# Patient Record
Sex: Male | Born: 1980 | Race: White | Hispanic: No | Marital: Single | Smoking: Current every day smoker
Health system: Southern US, Community
[De-identification: ages and names within clinical notes are randomized; demographics above are authoritative.]

## PROBLEM LIST (undated history)

## (undated) DIAGNOSIS — M1990 Unspecified osteoarthritis, unspecified site: Secondary | ICD-10-CM

---

## 2015-02-25 ENCOUNTER — Encounter: Payer: Self-pay | Admitting: Emergency Medicine

## 2015-02-25 ENCOUNTER — Emergency Department
Admission: EM | Admit: 2015-02-25 | Discharge: 2015-02-25 | Disposition: A | Discharge status: Home / Self Care | Payer: Managed Care, Other (non HMO) | Attending: Emergency Medicine | Admitting: Emergency Medicine

## 2015-02-25 DIAGNOSIS — Z720 Tobacco use: Secondary | ICD-10-CM | POA: Diagnosis not present

## 2015-02-25 DIAGNOSIS — K088 Other specified disorders of teeth and supporting structures: Secondary | ICD-10-CM | POA: Diagnosis present

## 2015-02-25 DIAGNOSIS — K0889 Other specified disorders of teeth and supporting structures: Secondary | ICD-10-CM

## 2015-02-25 HISTORY — DX: Unspecified osteoarthritis, unspecified site: M19.90

## 2015-02-25 MED ORDER — AMOXICILLIN 500 MG PO TABS: 500.0000 mg | ORAL_TABLET | Freq: Three times a day (TID) | ORAL | Status: DC

## 2015-02-25 MED ORDER — HYDROCODONE-ACETAMINOPHEN 5-325 MG PO TABS: 1.0000 | ORAL_TABLET | ORAL | Status: DC | PRN

## 2015-02-25 NOTE — ED Notes (Signed)
toothache

## 2015-02-25 NOTE — Discharge Instructions (Signed)
Dental Pain °A tooth ache may be caused by cavities (tooth decay). Cavities expose the nerve of the tooth to air and hot or cold temperatures. It may come from an infection or abscess (also called a boil or furuncle) around your tooth. It is also often caused by dental caries (tooth decay). This causes the pain you are having. °DIAGNOSIS  °Your caregiver can diagnose this problem by exam. °TREATMENT  °· If caused by an infection, it may be treated with medications which kill germs (antibiotics) and pain medications as prescribed by your caregiver. Take medications as directed. °· Only take over-the-counter or prescription medicines for pain, discomfort, or fever as directed by your caregiver. °· Whether the tooth ache today is caused by infection or dental disease, you should see your dentist as soon as possible for further care. °SEEK MEDICAL CARE IF: °The exam and treatment you received today has been provided on an emergency basis only. This is not a substitute for complete medical or dental care. If your problem worsens or new problems (symptoms) appear, and you are unable to meet with your dentist, call or return to this location. °SEEK IMMEDIATE MEDICAL CARE IF:  °· You have a fever. °· You develop redness and swelling of your face, jaw, or neck. °· You are unable to open your mouth. °· You have severe pain uncontrolled by pain medicine. °MAKE SURE YOU:  °· Understand these instructions. °· Will watch your condition. °· Will get help right away if you are not doing well or get worse. °Document Released: 07/02/2005 Document Revised: 09/24/2011 Document Reviewed: 02/18/2008 °ExitCare® Patient Information ©2015 [employer]. This information is not intended to replace advice given to you by your health care provider. Make sure you discuss any questions you have with your health care provider. °OPTIONS FOR DENTAL FOLLOW UP CARE ° °[place] [facility] Health and Human Services - Local Safety Net Dental  Clinics °http://www.ncdhhs.gov/dph/oralhealth/services/safetynetclinics.htm °  °[facility] (336-562-3123) ° °[facility] (919-933-9087) ° °[facility] (919-663-1744 ext 237) ° °[facility] (336-570-6415) ° °[facility] (919-968-2025) °This clinic caters to the indigent population and is on a lottery system. °Location: °[facility], [address], [place] °Clinic Hours: °Wednesdays from 6pm - 9pm, patients seen by a lottery system. °For dates, call or go to www.med.[facility].edu/[facility]/patients/Dental-[facility] °Services: °Cleanings, fillings and simple extractions. °Payment Options: °DENTAL WORK IS FREE OF CHARGE. Bring proof of income or support. °Best way to get seen: °Arrive at 5:15 pm - this is a lottery, NOT first come/first serve, so arriving earlier will not increase your chances of being seen. °  °  °[facility] °919-537-3737 °Select option 1 for emergencies °  °Location: °[facility], [address], [place] °Clinic Hours: °No walk-ins accepted - call the day before to schedule an appointment. °Check in times are 9:30 am and 1:30 pm. °Services: °Simple extractions, temporary fillings, pulpectomy/pulp debridement, uncomplicated abscess drainage. °Payment Options: °PAYMENT IS DUE AT THE TIME OF SERVICE.  Fee is usually $100-200, additional surgical procedures (e.g. abscess drainage) may be extra. °Cash, checks, Visa/MasterCard accepted.  Can file Medicaid if patient is covered for dental - patient should call case worker to check. °No discount for [facility] patients. °Best way to get seen: °MUST call the day before and get onto the schedule. Can usually be seen the next 1-2 days. No walk-ins accepted. °  °  °[facility] °919-933-9087 °  °Location: °[facility], [address], [place] °Clinic Hours: °M, W, Th, F 8am or 1:30pm, Tues  9a or 1:30 - first come/first served. °Services: °Simple extractions, temporary fillings, uncomplicated abscess drainage.  You do not need to be an [place] resident. °Payment Options: °PAYMENT IS DUE AT THE TIME OF SERVICE. °Dental insurance, otherwise sliding scale - bring proof of income or support. °Depending on income and treatment needed, cost is usually $50-200. °Best way to get seen: °Arrive early as it is first come/first served. °  °  °[facility] °919-542-1641 °  °Location: °[address] °Clinic Hours: °Mon-Thu 8a-5p °Services: °Most basic dental services including extractions and fillings. °Payment Options: °PAYMENT IS DUE AT THE TIME OF SERVICE. °Sliding scale, up to 50% off - bring proof if income or support. °Medicaid with dental option accepted. °Best way to get seen: °Call to schedule an appointment, can usually be seen within 2 weeks OR they will try to see walk-ins - show up at 8a or 2p (you may have to wait). °  °  °[facility] °919-245-2435 °[place] RESIDENTS ONLY °  °Location: °[facility], [address], [place], [place] [address] °Clinic Hours: By appointment only. °Monday - Thursday 8am-5pm, Friday 8am-12pm °Services: Cleanings, fillings, extractions. °Payment Options: °PAYMENT IS DUE AT THE TIME OF SERVICE. °Cash, Visa or MasterCard. Sliding scale - $30 minimum per service. °Best way to get seen: °Come in to office, complete packet and make an appointment - need proof of income °or support monies for each household member and proof of [place] residence. °Usually takes about a month to get in. °  °  °[facility] °919-956-4038 °  °Location: °[address] [place] °Clinic Hours: Walk-in Urgent Care Dental Services are offered Monday-Friday mornings only. °The numbers of emergencies accepted daily is limited to the number of °providers available. °Maximum 15 -  Mondays, Wednesdays & Thursdays °Maximum 10 - Tuesdays & Fridays °Services: °You do not need to be a [place] resident to be seen for a dental emergency. °Emergencies are defined as pain, swelling, abnormal bleeding, or dental trauma. Walkins will receive x-rays if needed. °NOTE: Dental cleaning is not an emergency. °Payment Options: °PAYMENT IS DUE AT THE TIME OF SERVICE. °Minimum co-pay is $40.00 for uninsured patients. °Minimum co-pay is $3.00 for Medicaid with dental coverage. °Dental Insurance is accepted and must be presented at time of visit. °Medicare does not cover dental. °Forms of payment: Cash, credit card, checks. °Best way to get seen: °If not previously registered with the clinic, walk-in dental registration begins at 7:15 am and is on a first come/first serve basis. °If previously registered with the clinic, call to make an appointment. °  °  °[facility] °919-776-4359 °[place] RESIDENTS ONLY °  °Location: °[address], [place], [place] °Clinic Hours: °Mon-Thu 10a-2p °Services: Extractions only! °Payment Options: °FREE (donations accepted) - bring proof of income or support °Best way to get seen: °Call and schedule an appointment OR come at 8am on the 1st Monday of every month (except for holidays) when it is first come/first served. °  °  °[facility] °[phone] °  °Location: °[address], [place] °Clinic Hours: °Friday mornings °Services, Payment Options, Best way to get seen: °Call for info °

## 2015-02-25 NOTE — ED Provider Notes (Signed)
[facility] Emergency Department Provider Note ____________________________________________  Time seen: Approximately 3:56 PM  I have reviewed the triage vital signs and the nursing notes.   HISTORY  Chief Complaint Dental Pain   HPI Troy Webster is a 34 y.o. male who presents to the emergency department for evaluation of dental pain. He has had a fractured tooth for quite some time but had never had any pain till 2 days ago. He has tried over-the-counter Tylenol and ibuprofen as well as a temporary filling.   Past Medical History  Diagnosis Date  . Arthritis     There are no active problems to display for this patient.   History reviewed. No pertinent past surgical history.  Current Outpatient Rx  Name  Route  Sig  Dispense  Refill  . amoxicillin (AMOXIL) 500 MG tablet   Oral   Take 1 tablet (500 mg total) by mouth 3 (three) times daily.   30 tablet   0   . HYDROcodone-acetaminophen (NORCO/VICODIN) 5-325 MG per tablet   Oral   Take 1 tablet by mouth every 4 (four) hours as needed.   12 tablet   0     Allergies Review of patient's allergies indicates no known allergies.  History reviewed. No pertinent family history.  Social History Social History  Substance Use Topics  . Smoking status: Current Every Day Smoker  . Smokeless tobacco: None  . Alcohol Use: Yes    Review of Systems Constitutional: No fever/chills Eyes: No visual changes. ENT: No sore throat. Cardiovascular: Denies chest pain. Respiratory: Denies shortness of breath. Gastrointestinal: No abdominal pain.  No nausea, no vomiting.  Genitourinary: Negative for dysuria. Musculoskeletal: Negative for back pain. Skin: Negative for rash. Neurological: Negative for headaches, focal weakness or numbness. 10-point ROS otherwise negative.  ____________________________________________   PHYSICAL EXAM:  VITAL SIGNS: ED Triage Vitals  Enc Vitals Group     BP  02/25/15 1532 127/83 mmHg     Pulse Rate 02/25/15 1532 69     Resp 02/25/15 1532 16     Temp 02/25/15 1532 98.9 F (37.2 C)     Temp Source 02/25/15 1532 Oral     SpO2 02/25/15 1532 99 %     Weight 02/25/15 1532 144 lb (65.318 kg)     Height 02/25/15 1532 5\' 10"  (1.778 m)     Head Cir --      Peak Flow --      Pain Score 02/25/15 1533 10     Pain Loc --      Pain Edu? --      Excl. in GC? --     Constitutional: Alert and oriented. Well appearing and in no acute distress. Eyes: Conjunctivae are normal. PERRL. EOMI. Head: Atraumatic. Nose: No congestion/rhinnorhea. Mouth/Throat: Mucous membranes are moist.  Oropharynx non-erythematous. Periodontal Exam    Neck: No stridor.  Hematological/Lymphatic/Immunilogical: No cervical lymphadenopathy. Cardiovascular:   Good peripheral circulation. Respiratory: Normal respiratory effort.  No retractions. Musculoskeletal: No lower extremity tenderness nor edema.  No joint effusions. Neurologic:  Normal speech and language. No gross focal neurologic deficits are appreciated. Speech is normal. No gait instability. Skin:  Skin is warm, dry and intact. No rash noted. Psychiatric: Mood and affect are normal. Speech and behavior are normal.  ____________________________________________   LABS (all labs ordered are listed, but only abnormal results are displayed)  Labs Reviewed - No data to display ____________________________________________   RADIOLOGY  Not indicated ____________________________________________   PROCEDURES  Procedure(s)  performed: None  Critical Care performed: No  ____________________________________________   INITIAL IMPRESSION / ASSESSMENT AND PLAN / ED COURSE  Pertinent labs & imaging results that were available during my care of the patient were reviewed by me and considered in my medical decision making (see chart for details).  Patient was advised to see the dentist within 14 days. Also advised to  take the antibiotic until finished. Instructed to return to the ER for symptoms that change or worsen if you are unable to schedule an appointment. ____________________________________________   FINAL CLINICAL IMPRESSION(S) / ED DIAGNOSES  Final diagnoses:  Pain, dental       Chinita Pester, FNP 02/25/15 1702  Governor Rooks, MD 02/25/15 2009

## 2015-04-12 ENCOUNTER — Ambulatory Visit
Admission: RE | Admit: 2015-04-12 | Discharge: 2015-04-12 | Disposition: A | Discharge status: Home / Self Care | Payer: Managed Care, Other (non HMO) | Source: Ambulatory Visit | Attending: Allergy | Admitting: Allergy

## 2015-04-12 ENCOUNTER — Other Ambulatory Visit: Payer: Self-pay | Admitting: Allergy

## 2015-04-12 DIAGNOSIS — J4540 Moderate persistent asthma, uncomplicated: Secondary | ICD-10-CM

## 2015-08-02 ENCOUNTER — Encounter: Payer: Self-pay | Admitting: Emergency Medicine

## 2015-08-02 ENCOUNTER — Emergency Department
Admission: EM | Admit: 2015-08-02 | Discharge: 2015-08-02 | Disposition: A | Discharge status: Home / Self Care | Payer: BLUE CROSS/BLUE SHIELD | Attending: Emergency Medicine | Admitting: Emergency Medicine

## 2015-08-02 DIAGNOSIS — X58XXXA Exposure to other specified factors, initial encounter: Secondary | ICD-10-CM | POA: Diagnosis not present

## 2015-08-02 DIAGNOSIS — K047 Periapical abscess without sinus: Secondary | ICD-10-CM | POA: Diagnosis not present

## 2015-08-02 DIAGNOSIS — Y9389 Activity, other specified: Secondary | ICD-10-CM | POA: Diagnosis not present

## 2015-08-02 DIAGNOSIS — F17200 Nicotine dependence, unspecified, uncomplicated: Secondary | ICD-10-CM | POA: Insufficient documentation

## 2015-08-02 DIAGNOSIS — Y998 Other external cause status: Secondary | ICD-10-CM | POA: Insufficient documentation

## 2015-08-02 DIAGNOSIS — S0993XA Unspecified injury of face, initial encounter: Secondary | ICD-10-CM

## 2015-08-02 DIAGNOSIS — K029 Dental caries, unspecified: Secondary | ICD-10-CM | POA: Insufficient documentation

## 2015-08-02 DIAGNOSIS — Y9289 Other specified places as the place of occurrence of the external cause: Secondary | ICD-10-CM | POA: Insufficient documentation

## 2015-08-02 DIAGNOSIS — S025XXA Fracture of tooth (traumatic), initial encounter for closed fracture: Secondary | ICD-10-CM | POA: Insufficient documentation

## 2015-08-02 DIAGNOSIS — K0889 Other specified disorders of teeth and supporting structures: Secondary | ICD-10-CM | POA: Diagnosis present

## 2015-08-02 MED ORDER — AMOXICILLIN 500 MG PO CAPS: 500.0000 mg | ORAL_CAPSULE | Freq: Three times a day (TID) | ORAL | Status: AC

## 2015-08-02 MED ORDER — LIDOCAINE VISCOUS 2 % MT SOLN: 20.0000 mL | OROMUCOSAL | Status: AC | PRN

## 2015-08-02 MED ORDER — TRAMADOL HCL 50 MG PO TABS: 50.0000 mg | ORAL_TABLET | Freq: Four times a day (QID) | ORAL | Status: AC | PRN

## 2015-08-02 NOTE — ED Notes (Signed)
STATES HE BROKE A TOOTH IN DEC  CONTS TO HAVE INCREASED PAIN  HAS APPT ON 2/8

## 2015-08-02 NOTE — Discharge Instructions (Signed)
Dental Abscess °A dental abscess is a collection of pus in or around a tooth. °CAUSES °This condition is caused by a bacterial infection around the root of the tooth that involves the inner part of the tooth (pulp). It may result from: °· Severe tooth decay. °· Trauma to the tooth that allows bacteria to enter into the pulp, such as a broken or chipped tooth. °· Severe gum disease around a tooth. °SYMPTOMS °Symptoms of this condition include: °· Severe pain in and around the infected tooth. °· Swelling and redness around the infected tooth, in the mouth, or in the face. °· Tenderness. °· Pus drainage. °· Bad breath. °· Bitter taste in the mouth. °· Difficulty swallowing. °· Difficulty opening the mouth. °· Nausea. °· Vomiting. °· Chills. °· Swollen neck glands. °· Fever. °DIAGNOSIS °This condition is diagnosed with examination of the infected tooth. During the exam, your dentist may tap on the infected tooth. Your dentist will also ask about your medical and dental history and may order X-rays. °TREATMENT °This condition is treated by eliminating the infection. This may be done with: °· Antibiotic medicine. °· A root canal. This may be performed to save the tooth. °· Pulling (extracting) the tooth. This may also involve draining the abscess. This is done if the tooth cannot be saved. °HOME CARE INSTRUCTIONS °· Take medicines only as directed by your dentist. °· If you were prescribed antibiotic medicine, finish all of it even if you start to feel better. °· Rinse your mouth (gargle) often with salt water to relieve pain or swelling. °· Do not drive or operate heavy machinery while taking pain medicine. °· Do not apply heat to the outside of your mouth. °· Keep all follow-up visits as directed by your dentist. This is important. °SEEK MEDICAL CARE IF: °· Your pain is worse and is not helped by medicine. °SEEK IMMEDIATE MEDICAL CARE IF: °· You have a fever or chills. °· Your symptoms suddenly get worse. °· You have a  very bad headache. °· You have problems breathing or swallowing. °· You have trouble opening your mouth. °· You have swelling in your neck or around your eye. °  °This information is not intended to replace advice given to you by your health care provider. Make sure you discuss any questions you have with your health care provider. °  °Document Released: 07/02/2005 Document Revised: 11/16/2014 Document Reviewed: 06/29/2014 °Elsevier Interactive Patient Education ©2016 Else[employer] Inc[employer] ° °Dental Care and Dentist Visits °Dental care supports good overall health. Regular dental visits can also help you avoid dental pain, bleeding, infection, and other more serious health problems in the future. It is important to keep the mouth healthy because diseases in the teeth, gums, and other oral tissues can spread to other areas of the body. Some problems, such as diabetes, heart disease, and pre-term labor have been associated with poor oral health.  °See your dentist every 6 months. If you experience emergency problems such as a toothache or broken tooth, go to the dentist right away. If you see your dentist regularly, you may catch problems early. It is easier to be treated for problems in the early stages.  °WHAT TO EXPECT AT A DENTIST VISIT  °Your dentist will look for many common oral health problems and recommend proper treatment. At your regular dental visit, you can expect: °· Gentle cleaning of the teeth and gums. This includes scraping and polishing. This helps to remove the sticky substance around the teeth and gums (  plaque). Plaque forms in the mouth shortly after eating. Over time, plaque hardens on the teeth as tartar. If tartar is not removed regularly, it can cause problems. Cleaning also helps remove stains. °· Periodic X-rays. These pictures of the teeth and supporting bone will help your dentist assess the health of your teeth. °· Periodic fluoride treatments. Fluoride is a natural mineral shown to help  strengthen teeth. Fluoride treatment involves applying a fluoride gel or varnish to the teeth. It is most commonly done in children. °· Examination of the mouth, tongue, jaws, teeth, and gums to look for any oral health problems, such as: °¨ Cavities (dental caries). This is decay on the tooth caused by plaque, sugar, and acid in the mouth. It is best to catch a cavity when it is small. °¨ Inflammation of the gums caused by plaque buildup (gingivitis). °¨ Problems with the mouth or malformed or misaligned teeth. °¨ Oral cancer or other diseases of the soft tissues or jaws.  °KEEP YOUR TEETH AND GUMS HEALTHY °For healthy teeth and gums, follow these general guidelines as well as your dentist's specific advice: °· Have your teeth professionally cleaned at the dentist every 6 months. °· Brush twice daily with a fluoride toothpaste. °· Floss your teeth daily.  °· Ask your dentist if you need fluoride supplements, treatments, or fluoride toothpaste. °· Eat a healthy diet. Reduce foods and drinks with added sugar. °· Avoid smoking. °TREATMENT FOR ORAL HEALTH PROBLEMS °If you have oral health problems, treatment varies depending on the conditions present in your teeth and gums. °· Your caregiver will most likely recommend good oral hygiene at each visit. °· For cavities, gingivitis, or other oral health disease, your caregiver will perform a procedure to treat the problem. This is typically done at a separate appointment. Sometimes your caregiver will refer you to another dental specialist for specific tooth problems or for surgery. °SEEK IMMEDIATE DENTAL CARE IF: °· You have pain, bleeding, or soreness in the gum, tooth, jaw, or mouth area. °· A permanent tooth becomes loose or separated from the gum socket. °· You experience a blow or injury to the mouth or jaw area. °  °This information is not intended to replace advice given to you by your health care provider. Make sure you discuss any questions you have with your  health care provider. °  °Document Released: 03/14/2011 Document Revised: 09/24/2011 Document Reviewed: 03/14/2011 °Elsevier Interactive Patient Education ©2016 Elsevier Inc. ° °

## 2015-08-02 NOTE — ED Provider Notes (Signed)
CSN: [id number]     Arrival date & time 08/02/15  0808 History   First MD Initiated Contact with Patient 08/02/15 0815     Chief Complaint  Patient presents with  . Dental Pain    HPI Comments: 35 year old male presents today complaining of dental fracture that occurred on 07/10/15. Has an appointment with dentist at the beginning of February for evaluation. Continues to have a lot of pain and now he has gum swelling associated with the injury. No fevers, sweats or chills. No drainage from the gums.    Past Medical History  Diagnosis Date  . Arthritis    History reviewed. No pertinent past surgical history. No family history on file. Social History  Substance Use Topics  . Smoking status: Current Every Day Smoker  . Smokeless tobacco: None  . Alcohol Use: Yes    Review of Systems  Constitutional: Negative for fever and chills.  HENT: Positive for dental problem.   All other systems reviewed and are negative.     Allergies  Review of patient's allergies indicates no known allergies.  Home Medications   Prior to Admission medications   Medication Sig Start Date End Date Taking? Authorizing Provider  amoxicillin (AMOXIL) 500 MG capsule Take 1 capsule (500 mg total) by mouth 3 (three) times daily. 08/02/15 08/12/15  Christella Scheuermann, PA-C  lidocaine (XYLOCAINE) 2 % solution Use as directed 20 mLs in the mouth or throat as needed for mouth pain. 08/02/15   Christella Scheuermann, PA-C  traMADol (ULTRAM) 50 MG tablet Take 1 tablet (50 mg total) by mouth every 6 (six) hours as needed. 08/02/15 08/01/16  Suan Halter V, PA-C   BP 131/77 mmHg  Pulse 97  Temp(Src) 97.9 F (36.6 C) (Oral)  Resp 18  Ht  (1.778 m)  Wt 63.504 kg  BMI 20.09 kg/m2  SpO2 96% Physical Exam  Constitutional: He is oriented to person, place, and time. Vital signs are normal. He appears well-developed and well-nourished. He is active.  Non-toxic appearance. He does not have a sickly appearance. He does not  appear ill.  HENT:  Head: Normocephalic and atraumatic.  Mouth/Throat: Uvula is midline, oropharynx is clear and moist and mucous membranes are normal.    Extensive dental caries   Eyes: Conjunctivae are normal. Pupils are equal, round, and reactive to light.  Neck: Normal range of motion. Neck supple.  Lymphadenopathy:    He has no cervical adenopathy.  Neurological: He is alert and oriented to person, place, and time.  Skin: Skin is warm and dry.  Psychiatric: He has a normal mood and affect. His behavior is normal. Judgment and thought content normal.  Nursing note and vitals reviewed.   ED Course  Procedures (including critical care time) Labs Review Labs Reviewed - No data to display  Imaging Review No results found. I have personally reviewed and evaluated these images and lab results as part of my medical decision-making.   EKG Interpretation None      MDM  Amox  TID x 10 days Ultram as needed for pain - do not take while working or driving Viscous lidocaine as needed during the day Follow up with dentist as scheduled  Final diagnoses:  Dental abscess  Dental injury, initial encounter        Christella Scheuermann, PA-C 08/02/15 8295  Myrna Blazer, MD 08/02/15 [phone]

## 2015-11-14 IMAGING — CR DG CHEST 2V
1 series · 2 of 2 positions shown · non-contrast
Comparison: None in PACs

CLINICAL DATA: Several year history of wheezing, occasional chest
pain, history of asthma, current smoker.

EXAM:
CHEST  2 VIEW

[Series 1: dg chest 2 view · 0.14mm/px · 2 of 2 slices shown]
[im 1/2]
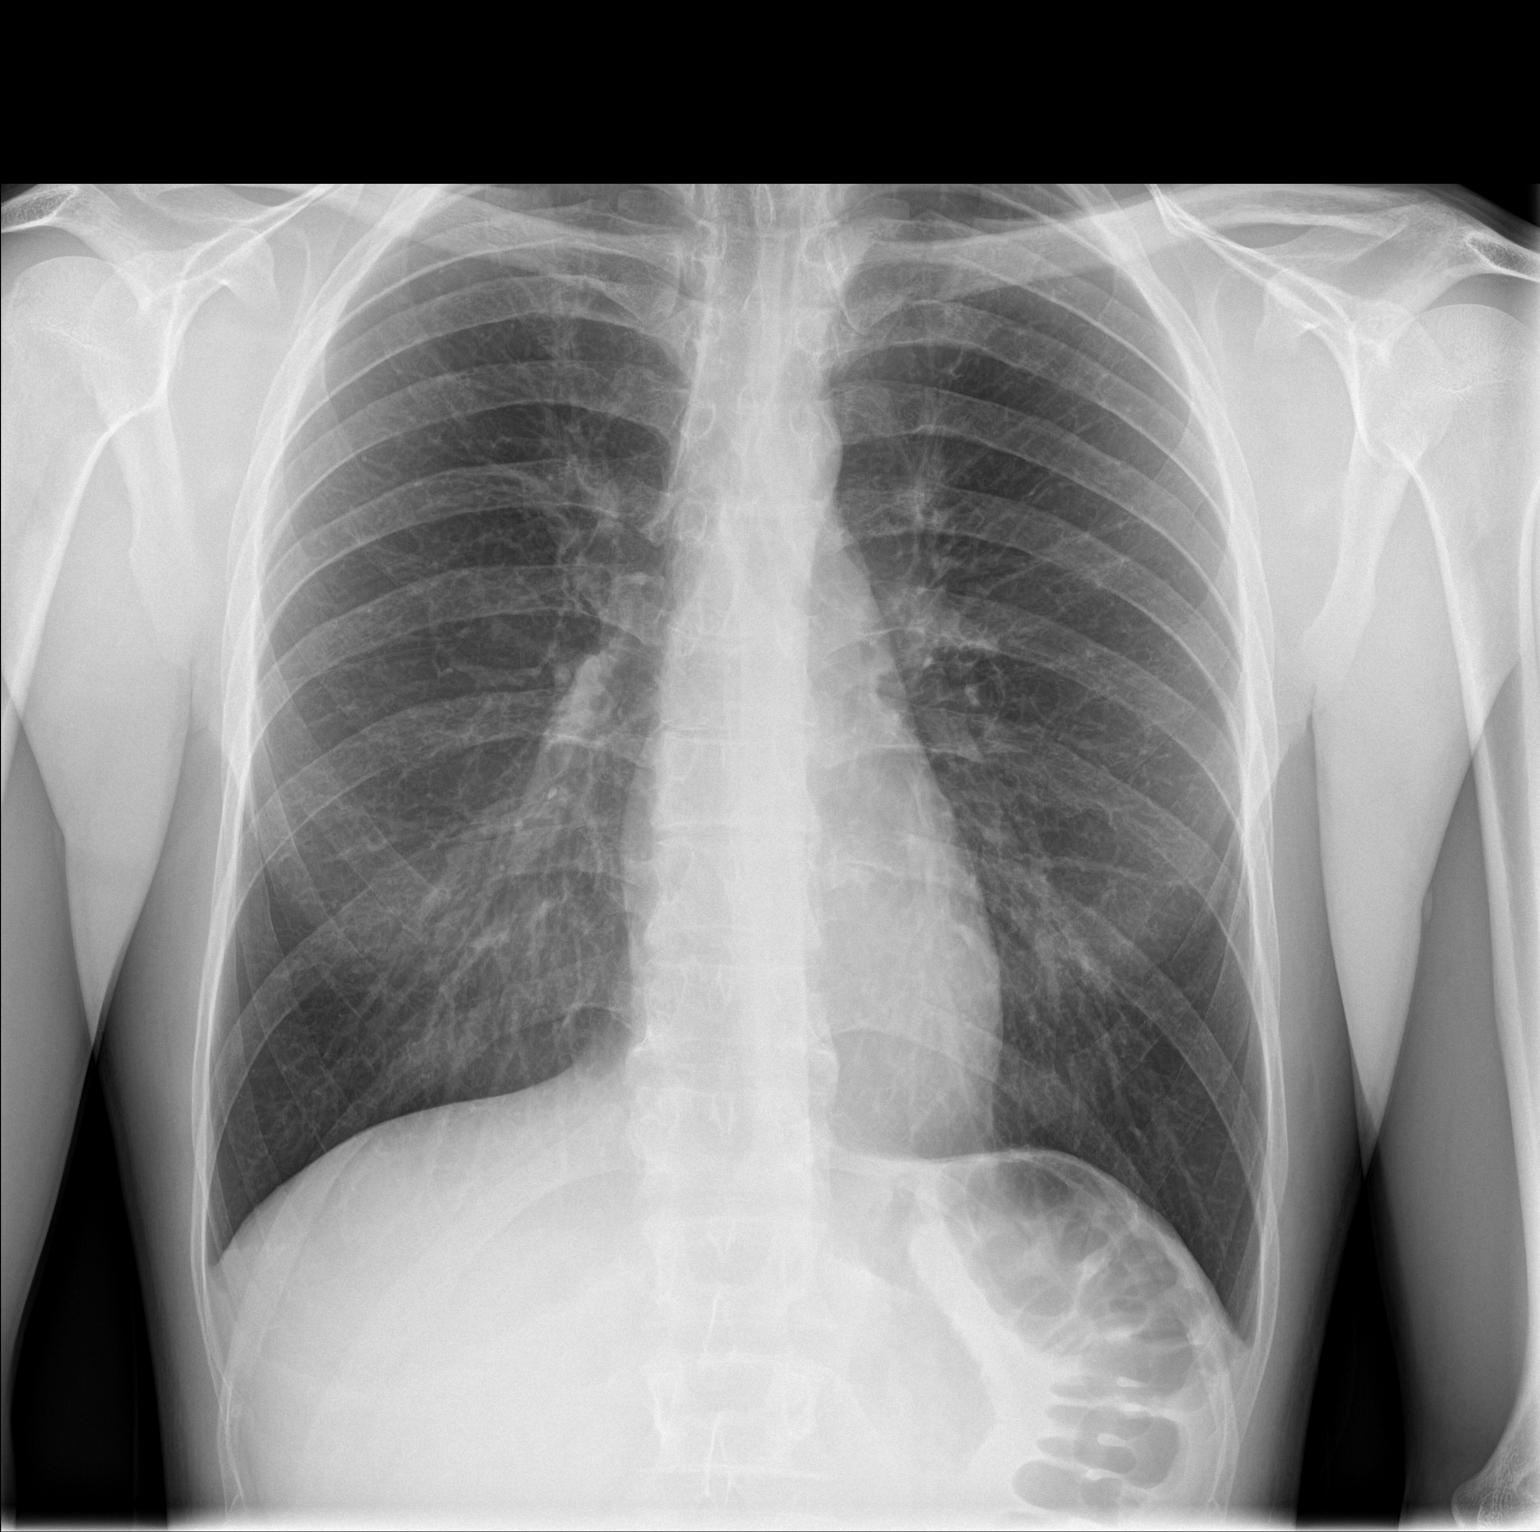
[im 2/2]
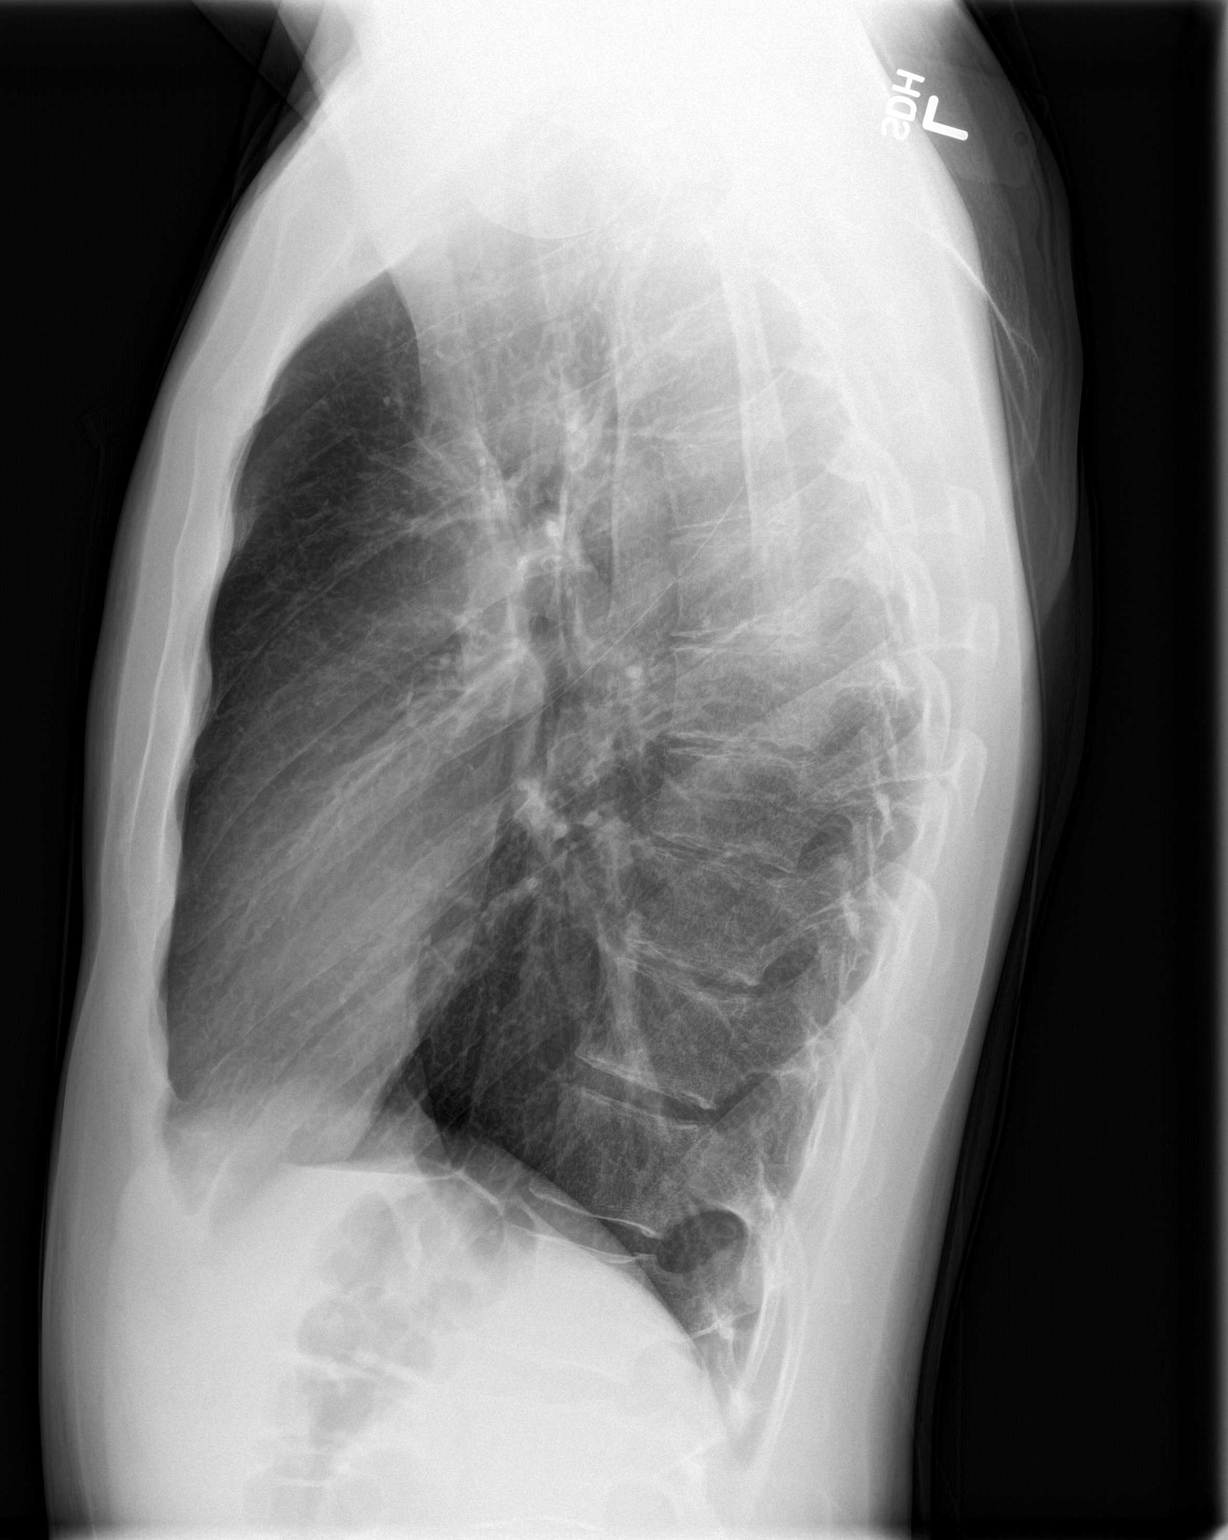

[2 of 2 positions shown; findings below may reference images not displayed]

FINDINGS: The lungs are hyperinflated. There is no focal infiltrate. There is
no pleural effusion or pneumothorax. The heart and pulmonary
vascularity are normal. The mediastinum is normal in width. The bony
thorax is unremarkable.
IMPRESSION: There is no active cardiopulmonary disease. Mild hyperinflation
likely reflects the patient's known asthma.
